# Patient Record
Sex: Male | Born: 1970 | Race: Black or African American | Hispanic: No | Marital: Single
Health system: Southern US, Community
[De-identification: ages and names within clinical notes are randomized; demographics above are authoritative.]

---

## 2004-07-26 ENCOUNTER — Emergency Department: Payer: Self-pay | Admitting: Internal Medicine

## 2007-09-15 ENCOUNTER — Emergency Department: Payer: Self-pay | Admitting: Emergency Medicine

## 2009-01-30 ENCOUNTER — Emergency Department: Payer: Self-pay | Admitting: Emergency Medicine

## 2009-03-19 ENCOUNTER — Emergency Department: Payer: Self-pay | Admitting: Emergency Medicine

## 2010-04-15 ENCOUNTER — Ambulatory Visit: Payer: Self-pay

## 2012-04-03 ENCOUNTER — Ambulatory Visit: Payer: Self-pay | Admitting: Family Medicine

## 2014-03-15 ENCOUNTER — Ambulatory Visit: Payer: Self-pay | Admitting: Emergency Medicine

## 2014-10-27 ENCOUNTER — Ambulatory Visit: Payer: 59 | Admitting: Registered Nurse

## 2014-10-27 ENCOUNTER — Encounter: Payer: Self-pay | Admitting: Gastroenterology

## 2014-10-27 ENCOUNTER — Encounter
Admission: RE | Disposition: A | Discharge status: Home / Self Care | Payer: Self-pay | Source: Ambulatory Visit | Attending: Gastroenterology

## 2014-10-27 ENCOUNTER — Ambulatory Visit
Admission: RE | Admit: 2014-10-27 | Discharge: 2014-10-27 | Disposition: A | Discharge status: Home / Self Care | Payer: 59 | Source: Ambulatory Visit | Attending: Gastroenterology | Admitting: Gastroenterology

## 2014-10-27 DIAGNOSIS — Z1211 Encounter for screening for malignant neoplasm of colon: Secondary | ICD-10-CM | POA: Insufficient documentation

## 2014-10-27 DIAGNOSIS — Z86010 Personal history of colonic polyps: Secondary | ICD-10-CM | POA: Diagnosis present

## 2014-10-27 HISTORY — PX: COLONOSCOPY WITH PROPOFOL: SHX5780

## 2014-10-27 SURGERY — COLONOSCOPY WITH PROPOFOL
Anesthesia: General

## 2014-10-27 MED ORDER — MIDAZOLAM HCL 2 MG/2ML IJ SOLN
INTRAMUSCULAR | Status: DC | PRN
  Administered 2014-10-27: 1 mg via INTRAVENOUS

## 2014-10-27 MED ORDER — PROPOFOL 10 MG/ML IV BOLUS
INTRAVENOUS | Status: DC | PRN
  Administered 2014-10-27: 50 mg via INTRAVENOUS

## 2014-10-27 MED ORDER — PROPOFOL INFUSION 10 MG/ML OPTIME
INTRAVENOUS | Status: DC | PRN
  Administered 2014-10-27: 140 ug/kg/min via INTRAVENOUS

## 2014-10-27 MED ORDER — SODIUM CHLORIDE 0.9 % IV SOLN
INTRAVENOUS | Status: DC
Start: 2014-10-27 — End: 2014-10-27

## 2014-10-27 MED ORDER — SODIUM CHLORIDE 0.9 % IV SOLN
INTRAVENOUS | Status: DC
  Administered 2014-10-27: 1000 mL via INTRAVENOUS

## 2014-10-27 MED ORDER — FENTANYL CITRATE (PF) 100 MCG/2ML IJ SOLN
INTRAMUSCULAR | Status: DC | PRN
  Administered 2014-10-27: 50 ug via INTRAVENOUS

## 2014-10-27 NOTE — Anesthesia Preprocedure Evaluation (Signed)
Anesthesia Evaluation  Patient identified by MRN, date of birth, ID band Patient awake    Reviewed: Allergy & Precautions, H&P , NPO status , Patient's Chart, lab work & pertinent test results, reviewed documented beta blocker date and time   Airway Mallampati: II  TM Distance: >3 FB Neck ROM: full    Dental no notable dental hx.    Pulmonary neg pulmonary ROS,    Pulmonary exam normal breath sounds clear to auscultation       Cardiovascular Exercise Tolerance: Good negative cardio ROS   Rhythm:regular Rate:Normal     Neuro/Psych negative neurological ROS  negative psych ROS   GI/Hepatic negative GI ROS, Neg liver ROS,   Endo/Other  negative endocrine ROS  Renal/GU negative Renal ROS  negative genitourinary   Musculoskeletal   Abdominal   Peds  Hematology negative hematology ROS (+)   Anesthesia Other Findings   Reproductive/Obstetrics negative OB ROS                             Anesthesia Physical Anesthesia Plan  ASA: II  Anesthesia Plan: General   Post-op Pain Management:    Induction:   Airway Management Planned:   Additional Equipment:   Intra-op Plan:   Post-operative Plan:   Informed Consent: I have reviewed the patients History and Physical, chart, labs and discussed the procedure including the risks, benefits and alternatives for the proposed anesthesia with the patient or authorized representative who has indicated his/her understanding and acceptance.   Dental Advisory Given  Plan Discussed with: CRNA  Anesthesia Plan Comments:         Anesthesia Quick Evaluation  

## 2014-10-27 NOTE — Anesthesia Procedure Notes (Signed)
Date/Time: 10/27/2014 8:25 AM Performed by: Stormy Fabian Pre-anesthesia Checklist: Patient identified, Emergency Drugs available, Suction available and Patient being monitored Patient Re-evaluated:Patient Re-evaluated prior to inductionOxygen Delivery Method: Nasal cannula

## 2014-10-27 NOTE — Anesthesia Postprocedure Evaluation (Signed)
  Anesthesia Post-op Note  Patient: Adrian Carroll  Procedure(s) Performed: Procedure(s): COLONOSCOPY WITH PROPOFOL (N/A)  Anesthesia type:General  Patient location: PACU  Post pain: Pain level controlled  Post assessment: Post-op Vital signs reviewed, Patient's Cardiovascular Status Stable, Respiratory Function Stable, Patent Airway and No signs of Nausea or vomiting  Post vital signs: Reviewed and stable  Last Vitals:  Filed Vitals:   10/27/14 0918  BP: 145/90  Pulse: 58  Temp:   Resp: 12    Level of consciousness: awake, alert  and patient cooperative  Complications: No apparent anesthesia complications

## 2014-10-27 NOTE — Transfer of Care (Signed)
Immediate Anesthesia Transfer of Care Note  Patient: Adrian Carroll  Procedure(s) Performed: Procedure(s): COLONOSCOPY WITH PROPOFOL (N/A)  Patient Location: PACU and Endoscopy Unit  Anesthesia Type:General  Level of Consciousness: sedated  Airway & Oxygen Therapy: Patient Spontanous Breathing and Patient connected to nasal cannula oxygen  Post-op Assessment: Report given to RN and Post -op Vital signs reviewed and stable  Post vital signs: Reviewed and stable  Last Vitals:  Filed Vitals:   10/27/14 0844  BP: 108/75  Pulse:   Temp: 35.7 C  Resp: 20    Complications: No apparent anesthesia complications

## 2014-10-27 NOTE — Op Note (Signed)
[facility] Gastroenterology Patient Name: Adrian Carroll Procedure Date: 10/27/2014 8:25 AM MRN: [id number] Account #: [id number] Date of Birth: Dec 22, 1970 Admit Type: Outpatient Age: 44 Room: [facility] ENDO ROOM 4 Gender: Male Note Status: Finalized Procedure:         Colonoscopy Indications:       Personal history of colonic polyps Providers:         Ezzard Standing. Bluford Kaufmann, MD Referring MD:      Sallye Lat Md, MD (Referring MD) Medicines:         Monitored Anesthesia Care Complications:     No immediate complications. Procedure:         Pre-Anesthesia Assessment:                    - Prior to the procedure, a History and Physical was                     performed, and patient medications, allergies and                     sensitivities were reviewed. The patient's tolerance of                     previous anesthesia was reviewed.                    - The risks and benefits of the procedure and the sedation                     options and risks were discussed with the patient. All                     questions were answered and informed consent was obtained.                    - After reviewing the risks and benefits, the patient was                     deemed in satisfactory condition to undergo the procedure.                    After obtaining informed consent, the colonoscope was                     passed under direct vision. Throughout the procedure, the                     patient's blood pressure, pulse, and oxygen saturations                     were monitored continuously. The Olympus CF-Q160AL                     colonoscope (S#. [id number]) was introduced through the anus                     and advanced to the the cecum, identified by appendiceal                     orifice and ileocecal valve. The colonoscopy was performed                     without difficulty. The patient tolerated the procedure  well. The quality of the bowel preparation was  good. Findings:      The colon (entire examined portion) appeared normal. Impression:        - The entire examined colon is normal.                    - No specimens collected. Recommendation:    - Discharge patient to home.                    - Repeat colonoscopy in 10 years for surveillance.                    - The findings and recommendations were discussed with the                     patient. Procedure Code(s): --- Professional ---                    [phone], Colonoscopy, flexible; diagnostic, including                     collection of specimen(s) by brushing or washing, when                     performed (separate procedure) Diagnosis Code(s): --- Professional ---                    Z86.010, Personal history of colonic polyps CPT copyright 2014 American [employer] Association. All rights reserved. The codes documented in this report are preliminary and upon coder review may  be revised to meet current compliance requirements. Wallace Cullens, MD 10/27/2014 8:40:46 AM This report has been signed electronically. Number of Addenda: 0 Note Initiated On: 10/27/2014 8:25 AM Scope Withdrawal Time: 0 hours 5 minutes 24 seconds  Total Procedure Duration: 0 hours 9 minutes 5 seconds       [facility]

## 2014-10-27 NOTE — H&P (Signed)
    Primary Care Physician:  No primary care provider on file. Primary Gastroenterologist:  Dr. Bluford Kaufmann  Pre-Procedure History & Physical: HPI:  Adrian Carroll is a 44 y.o. male is here for an colonoscopy.   No past medical history on file.  No past surgical history on file.  Prior to Admission medications   Not on File    Allergies as of 09/19/2014  . (Not on File)    No family history on file.  Social History   Social History  . Marital Status: Single    Spouse Name: N/A  . Number of Children: N/A  . Years of Education: N/A   Occupational History  . Not on file.   Social History Main Topics  . Smoking status: Not on file  . Smokeless tobacco: Not on file  . Alcohol Use: Not on file  . Drug Use: Not on file  . Sexual Activity: Not on file   Other Topics Concern  . Not on file   Social History Narrative  . No narrative on file    Review of Systems: See HPI, otherwise negative ROS  Physical Exam: BP 125/80 mmHg  Pulse 68  Temp(Src) 97.5 F (36.4 C) (Tympanic)  Resp 16  Ht  (1.676 m)  Wt 72.576 kg (160 lb)  BMI 25.84 kg/m2  SpO2 100% General:   Alert,  pleasant and cooperative in NAD Head:  Normocephalic and atraumatic. Neck:  Supple; no masses or thyromegaly. Lungs:  Clear throughout to auscultation.    Heart:  Regular rate and rhythm. Abdomen:  Soft, nontender and nondistended. Normal bowel sounds, without guarding, and without rebound.   Neurologic:  Alert and  oriented x4;  grossly normal neurologically.  Impression/Plan: JODEN BONSALL is here for an colonoscopy to be performed for personal hx of colon polyps.  Risks, benefits, limitations, and alternatives regarding colonoscopy have been reviewed with the patient.  Questions have been answered.  All parties agreeable.   Maurisio Ruddy, Ezzard Standing, MD  10/27/2014, 7:58 AM

## 2017-11-21 ENCOUNTER — Other Ambulatory Visit: Payer: Self-pay

## 2017-11-21 ENCOUNTER — Emergency Department (HOSPITAL_COMMUNITY)
Admission: EM | Admit: 2017-11-21 | Discharge: 2017-11-22 | Disposition: A | Discharge status: Home / Self Care | Payer: No Typology Code available for payment source | Attending: Emergency Medicine | Admitting: Emergency Medicine

## 2017-11-21 ENCOUNTER — Emergency Department (HOSPITAL_COMMUNITY): Payer: No Typology Code available for payment source

## 2017-11-21 ENCOUNTER — Encounter (HOSPITAL_COMMUNITY): Payer: Self-pay | Admitting: Emergency Medicine

## 2017-11-21 DIAGNOSIS — F1010 Alcohol abuse, uncomplicated: Secondary | ICD-10-CM

## 2017-11-21 DIAGNOSIS — J438 Other emphysema: Secondary | ICD-10-CM | POA: Insufficient documentation

## 2017-11-21 DIAGNOSIS — Y929 Unspecified place or not applicable: Secondary | ICD-10-CM | POA: Insufficient documentation

## 2017-11-21 DIAGNOSIS — Y907 Blood alcohol level of 200-239 mg/100 ml: Secondary | ICD-10-CM | POA: Insufficient documentation

## 2017-11-21 DIAGNOSIS — T1490XA Injury, unspecified, initial encounter: Secondary | ICD-10-CM

## 2017-11-21 DIAGNOSIS — S0101XA Laceration without foreign body of scalp, initial encounter: Secondary | ICD-10-CM

## 2017-11-21 DIAGNOSIS — Y939 Activity, unspecified: Secondary | ICD-10-CM | POA: Diagnosis not present

## 2017-11-21 DIAGNOSIS — R2681 Unsteadiness on feet: Secondary | ICD-10-CM | POA: Insufficient documentation

## 2017-11-21 DIAGNOSIS — Y999 Unspecified external cause status: Secondary | ICD-10-CM | POA: Insufficient documentation

## 2017-11-21 DIAGNOSIS — F10920 Alcohol use, unspecified with intoxication, uncomplicated: Secondary | ICD-10-CM | POA: Insufficient documentation

## 2017-11-21 DIAGNOSIS — R52 Pain, unspecified: Secondary | ICD-10-CM

## 2017-11-21 DIAGNOSIS — S0990XA Unspecified injury of head, initial encounter: Secondary | ICD-10-CM | POA: Diagnosis present

## 2017-11-21 DIAGNOSIS — Z23 Encounter for immunization: Secondary | ICD-10-CM | POA: Insufficient documentation

## 2017-11-21 LAB — COMPREHENSIVE METABOLIC PANEL
ALT: 28 U/L (ref 0–44)
AST: 37 U/L (ref 15–41)
Albumin: 4.2 g/dL (ref 3.5–5.0)
Alkaline Phosphatase: 72 U/L (ref 38–126)
Anion gap: 9 (ref 5–15)
BUN: 8 mg/dL (ref 6–20)
CO2: 22 mmol/L (ref 22–32)
Calcium: 9.1 mg/dL (ref 8.9–10.3)
Chloride: 110 mmol/L (ref 98–111)
Creatinine, Ser: 1.09 mg/dL (ref 0.61–1.24)
Glucose, Bld: 102 mg/dL — ABNORMAL HIGH (ref 70–99)
POTASSIUM: 3.7 mmol/L (ref 3.5–5.1)
Sodium: 141 mmol/L (ref 135–145)
Total Bilirubin: 0.6 mg/dL (ref 0.3–1.2)
Total Protein: 7.3 g/dL (ref 6.5–8.1)

## 2017-11-21 LAB — URINALYSIS, ROUTINE W REFLEX MICROSCOPIC
BILIRUBIN URINE: NEGATIVE
GLUCOSE, UA: NEGATIVE mg/dL
KETONES UR: NEGATIVE mg/dL
LEUKOCYTES UA: NEGATIVE
NITRITE: NEGATIVE
PH: 6 (ref 5.0–8.0)
Protein, ur: NEGATIVE mg/dL
SPECIFIC GRAVITY, URINE: 1.002 — AB (ref 1.005–1.030)

## 2017-11-21 LAB — I-STAT CHEM 8, ED
BUN: 12 mg/dL (ref 6–20)
CREATININE: 1.4 mg/dL — AB (ref 0.61–1.24)
Calcium, Ion: 1.02 mmol/L — ABNORMAL LOW (ref 1.15–1.40)
Chloride: 110 mmol/L (ref 98–111)
Glucose, Bld: 106 mg/dL — ABNORMAL HIGH (ref 70–99)
HEMATOCRIT: 53 % — AB (ref 39.0–52.0)
HEMOGLOBIN: 18 g/dL — AB (ref 13.0–17.0)
Potassium: 6.2 mmol/L — ABNORMAL HIGH (ref 3.5–5.1)
Sodium: 142 mmol/L (ref 135–145)
TCO2: 24 mmol/L (ref 22–32)

## 2017-11-21 LAB — CBC
HEMATOCRIT: 46.3 % (ref 39.0–52.0)
HEMOGLOBIN: 15.6 g/dL (ref 13.0–17.0)
MCH: 32 pg (ref 26.0–34.0)
MCHC: 33.7 g/dL (ref 30.0–36.0)
MCV: 95.1 fL (ref 78.0–100.0)
Platelets: 312 10*3/uL (ref 150–400)
RBC: 4.87 MIL/uL (ref 4.22–5.81)
RDW: 13.3 % (ref 11.5–15.5)
WBC: 16.8 10*3/uL — AB (ref 4.0–10.5)

## 2017-11-21 LAB — SAMPLE TO BLOOD BANK

## 2017-11-21 LAB — PROTIME-INR
INR: 0.93
PROTHROMBIN TIME: 12.4 s (ref 11.4–15.2)

## 2017-11-21 LAB — I-STAT CG4 LACTIC ACID, ED: Lactic Acid, Venous: 2.33 mmol/L (ref 0.5–1.9)

## 2017-11-21 LAB — ETHANOL: Alcohol, Ethyl (B): 216 mg/dL — ABNORMAL HIGH (ref <10)

## 2017-11-21 MED ORDER — IOHEXOL 300 MG/ML  SOLN
100.0000 mL | Freq: Once | INTRAMUSCULAR | Status: AC | PRN
  Administered 2017-11-21: 100 mL via INTRAVENOUS

## 2017-11-21 MED ORDER — LACTATED RINGERS IV BOLUS
1000.0000 mL | Freq: Once | INTRAVENOUS | Status: AC
  Administered 2017-11-21: 1000 mL via INTRAVENOUS

## 2017-11-21 MED ORDER — SODIUM CHLORIDE 0.9 % IV SOLN
1.0000 g | Freq: Once | INTRAVENOUS | Status: AC
  Administered 2017-11-22: 1 g via INTRAVENOUS
  Filled 2017-11-21: qty 10

## 2017-11-21 MED ORDER — TETANUS-DIPHTH-ACELL PERTUSSIS 5-2.5-18.5 LF-MCG/0.5 IM SUSP
0.5000 mL | Freq: Once | INTRAMUSCULAR | Status: AC
  Administered 2017-11-21: 0.5 mL via INTRAMUSCULAR
  Filled 2017-11-21: qty 0.5

## 2017-11-21 MED ORDER — HALOPERIDOL LACTATE 5 MG/ML IJ SOLN
5.0000 mg | Freq: Once | INTRAMUSCULAR | Status: AC
  Administered 2017-11-21: 5 mg via INTRAMUSCULAR
  Filled 2017-11-21: qty 1

## 2017-11-21 MED ORDER — LORAZEPAM 2 MG/ML IJ SOLN
2.0000 mg | Freq: Once | INTRAMUSCULAR | Status: AC
  Administered 2017-11-21: 2 mg via INTRAMUSCULAR
  Filled 2017-11-21: qty 1

## 2017-11-21 MED ORDER — IOPAMIDOL (ISOVUE-300) INJECTION 61%: 100.0000 mL | Freq: Once | INTRAVENOUS | Status: DC | PRN

## 2017-11-21 NOTE — ED Notes (Signed)
Verified compatibility of calcium gluconate and lactated ringers with pharmacy
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
## 2017-11-21 NOTE — ED Notes (Signed)
Pt to CT and xray  
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
## 2017-11-22 ENCOUNTER — Emergency Department (HOSPITAL_COMMUNITY): Payer: No Typology Code available for payment source

## 2017-11-22 ENCOUNTER — Other Ambulatory Visit (HOSPITAL_COMMUNITY): Payer: Self-pay

## 2017-11-22 DIAGNOSIS — S0101XA Laceration without foreign body of scalp, initial encounter: Secondary | ICD-10-CM | POA: Diagnosis not present

## 2017-11-22 LAB — RAPID URINE DRUG SCREEN, HOSP PERFORMED
Amphetamines: NOT DETECTED
BENZODIAZEPINES: NOT DETECTED
Barbiturates: NOT DETECTED
Cocaine: NOT DETECTED
Opiates: NOT DETECTED
Tetrahydrocannabinol: POSITIVE — AB

## 2017-11-22 LAB — CDS SEROLOGY

## 2017-11-22 MED ORDER — SODIUM CHLORIDE 0.9 % IV BOLUS
1000.0000 mL | Freq: Once | INTRAVENOUS | Status: AC
  Administered 2017-11-22: 1000 mL via INTRAVENOUS

## 2017-11-22 NOTE — ED Notes (Addendum)
Ambulated pt. He was a little unsteady.assit him to the bed .as soon as he gotten  In bed he went fast a sleep

## 2017-11-22 NOTE — Discharge Instructions (Addendum)

## 2017-11-22 NOTE — ED Notes (Signed)
IVC rescention paperwork faxed
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
## 2017-11-22 NOTE — ED Notes (Signed)
Pt returned from CT. Linens wet. Pt changed into dry linens, on monitor, responds to voice if prompted.
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
